# Patient Record
Sex: Male | Born: 1975 | Race: White | Hispanic: Yes | Marital: Single | Smoking: Never smoker
Health system: Southern US, Community
[De-identification: ages and names within clinical notes are randomized; demographics above are authoritative.]

---

## 2004-11-06 ENCOUNTER — Emergency Department: Payer: Self-pay | Admitting: Emergency Medicine

## 2006-03-15 IMAGING — CR DG WRIST COMPLETE 3+V*R*
1 series · 1 of 1 positions shown · non-contrast
Comparison: none

REASON FOR EXAM: foreign body  rm12
COMMENTS:

PROCEDURE:     DXR - DXR WRIST RT COMP WITH OBLIQUES  - November 06, 2004  [DATE]
RESULT:     Multiple views of the RIGHT wrist show no evidence of fracture,
foreign body, or soft tissue swelling.

[view not recorded]
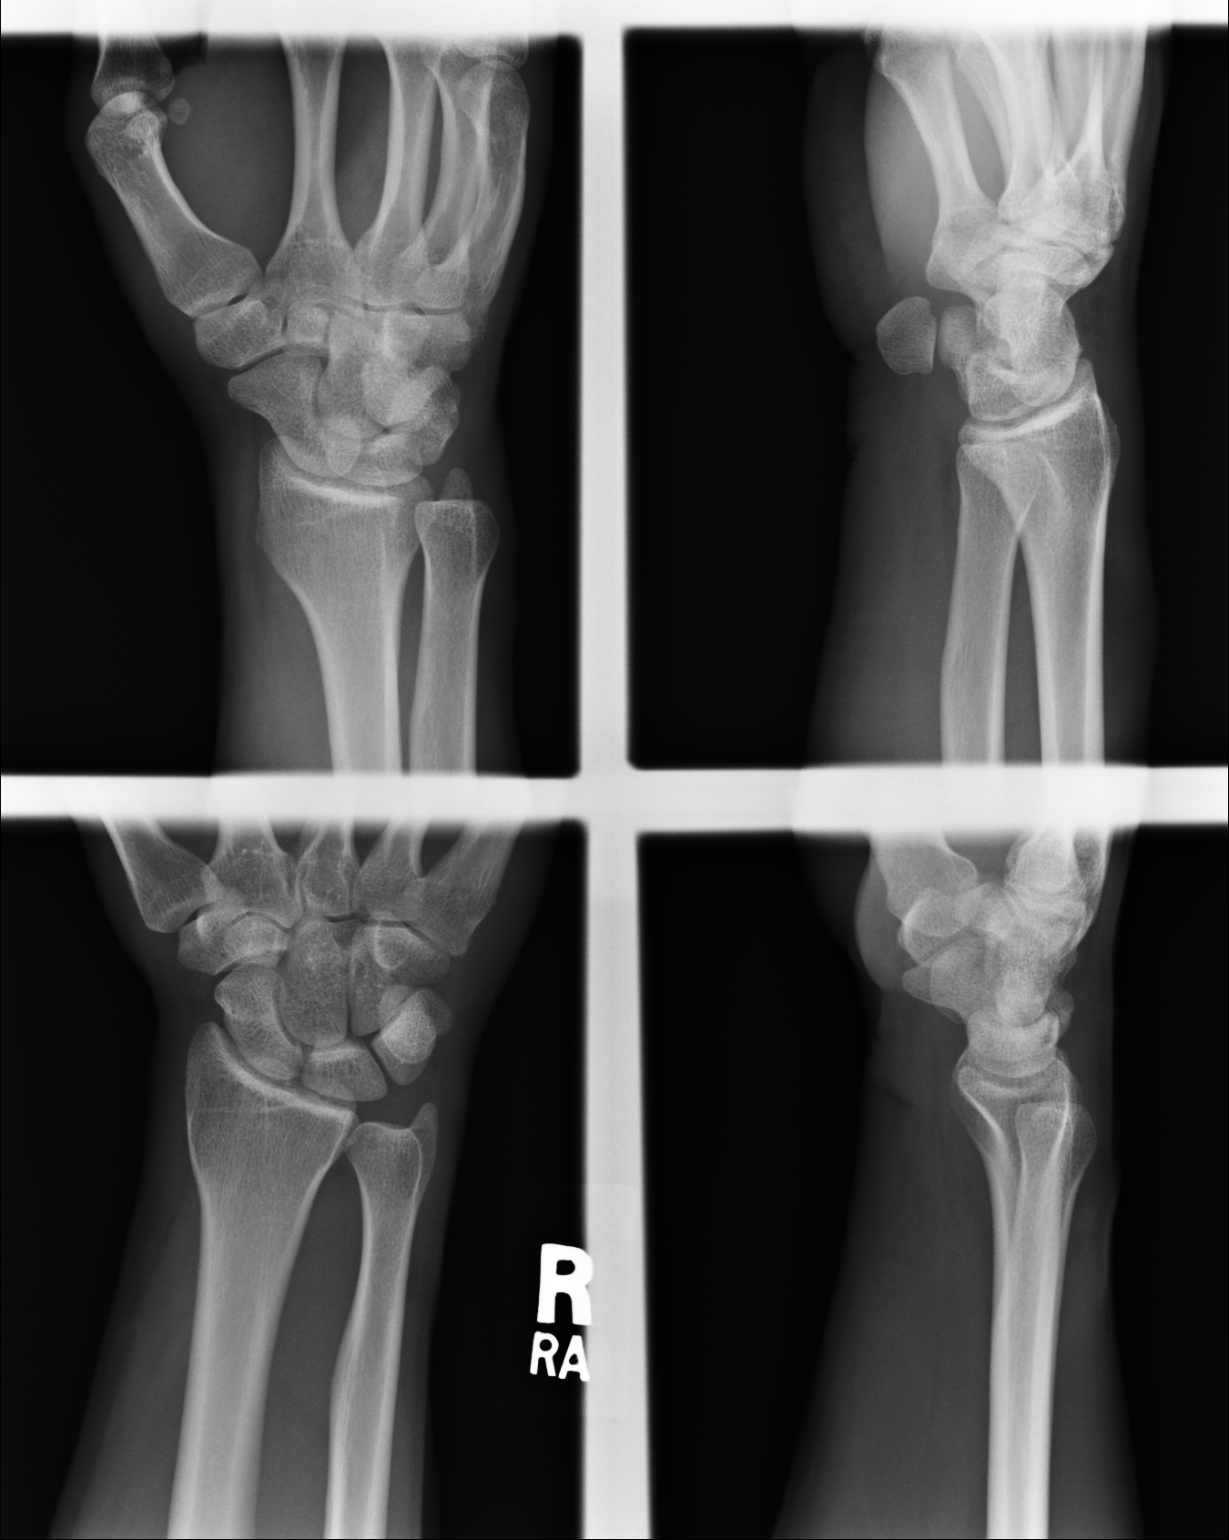

[1 of 1 positions shown; findings below may reference images not displayed]

IMPRESSION: 1)Normal RIGHT wrist.

## 2009-01-06 ENCOUNTER — Emergency Department (HOSPITAL_COMMUNITY): Admission: EM | Admit: 2009-01-06 | Discharge: 2009-01-06 | Payer: Self-pay | Admitting: Emergency Medicine

## 2009-02-03 ENCOUNTER — Emergency Department: Payer: Self-pay | Admitting: Emergency Medicine

## 2022-10-25 DIAGNOSIS — F1320 Sedative, hypnotic or anxiolytic dependence, uncomplicated: Secondary | ICD-10-CM | POA: Diagnosis not present

## 2022-10-25 DIAGNOSIS — F1010 Alcohol abuse, uncomplicated: Secondary | ICD-10-CM | POA: Diagnosis not present

## 2022-10-25 DIAGNOSIS — F310 Bipolar disorder, current episode hypomanic: Secondary | ICD-10-CM | POA: Diagnosis not present

## 2022-10-25 DIAGNOSIS — Z5181 Encounter for therapeutic drug level monitoring: Secondary | ICD-10-CM | POA: Diagnosis not present

## 2022-11-15 DIAGNOSIS — F1010 Alcohol abuse, uncomplicated: Secondary | ICD-10-CM | POA: Diagnosis not present

## 2022-11-15 DIAGNOSIS — F310 Bipolar disorder, current episode hypomanic: Secondary | ICD-10-CM | POA: Diagnosis not present

## 2022-12-15 DIAGNOSIS — F1010 Alcohol abuse, uncomplicated: Secondary | ICD-10-CM | POA: Diagnosis not present

## 2022-12-15 DIAGNOSIS — F310 Bipolar disorder, current episode hypomanic: Secondary | ICD-10-CM | POA: Diagnosis not present

## 2022-12-15 DIAGNOSIS — F1410 Cocaine abuse, uncomplicated: Secondary | ICD-10-CM | POA: Diagnosis not present

## 2022-12-22 DIAGNOSIS — Z5181 Encounter for therapeutic drug level monitoring: Secondary | ICD-10-CM | POA: Diagnosis not present

## 2022-12-22 DIAGNOSIS — F310 Bipolar disorder, current episode hypomanic: Secondary | ICD-10-CM | POA: Diagnosis not present

## 2022-12-22 DIAGNOSIS — Z79899 Other long term (current) drug therapy: Secondary | ICD-10-CM | POA: Diagnosis not present

## 2022-12-22 DIAGNOSIS — F1520 Other stimulant dependence, uncomplicated: Secondary | ICD-10-CM | POA: Diagnosis not present

## 2023-01-10 DIAGNOSIS — F1410 Cocaine abuse, uncomplicated: Secondary | ICD-10-CM | POA: Diagnosis not present

## 2023-01-10 DIAGNOSIS — F310 Bipolar disorder, current episode hypomanic: Secondary | ICD-10-CM | POA: Diagnosis not present

## 2023-01-19 DIAGNOSIS — Z5181 Encounter for therapeutic drug level monitoring: Secondary | ICD-10-CM | POA: Diagnosis not present

## 2023-01-19 DIAGNOSIS — Z79899 Other long term (current) drug therapy: Secondary | ICD-10-CM | POA: Diagnosis not present

## 2023-01-19 DIAGNOSIS — F310 Bipolar disorder, current episode hypomanic: Secondary | ICD-10-CM | POA: Diagnosis not present

## 2023-01-19 DIAGNOSIS — F1520 Other stimulant dependence, uncomplicated: Secondary | ICD-10-CM | POA: Diagnosis not present

## 2023-01-20 ENCOUNTER — Encounter: Payer: Self-pay | Admitting: Emergency Medicine

## 2023-01-20 ENCOUNTER — Other Ambulatory Visit: Payer: Self-pay

## 2023-01-20 ENCOUNTER — Emergency Department
Admission: EM | Admit: 2023-01-20 | Discharge: 2023-01-20 | Disposition: A | Discharge status: Home / Self Care | Payer: Medicaid Other | Source: Home / Self Care | Attending: Emergency Medicine | Admitting: Emergency Medicine

## 2023-01-20 DIAGNOSIS — T43595A Adverse effect of other antipsychotics and neuroleptics, initial encounter: Secondary | ICD-10-CM | POA: Diagnosis not present

## 2023-01-20 DIAGNOSIS — R42 Dizziness and giddiness: Secondary | ICD-10-CM | POA: Diagnosis not present

## 2023-01-20 DIAGNOSIS — T50905A Adverse effect of unspecified drugs, medicaments and biological substances, initial encounter: Secondary | ICD-10-CM

## 2023-01-20 NOTE — ED Triage Notes (Signed)
Pt here with dizziness. Pt states he was at work and went to bend down and immediately became dizzy and had to sit down. Pt denies dizziness at the moment. Pt having some diarrhea but denies NV.

## 2023-01-20 NOTE — Discharge Instructions (Signed)
I suspect your lightheadedness was related to the Abilify shot that you had yesterday, this is known to cause lightheadedness.  Please rest stay very hydrated and be careful when changing from a seated to standing position.  The symptoms should improve the further way you get from the injection time

## 2023-01-20 NOTE — ED Provider Notes (Signed)
[facility] Provider Note    Event Date/Time   First MD Initiated Contact with Patient 01/20/23 1148     (approximate)   History   Dizziness   HPI  Edwin Greer is a 47 y.o. male with no significant past medical history who presents with complaints of lightheadedness.  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  It does require him to stand and sit frequently but it is not particularly physical.  He reports he felt lightheaded and had to sit down and rest.  This is never happened to him before.  He denies chest pain or palpitations.  He reports he did get first injection of Abilify yesterday     Physical Exam   Triage Vital Signs: ED Triage Vitals [01/20/23 1141]  Encounter Vitals Group     BP 132/88     Systolic BP Percentile      Diastolic BP Percentile      Pulse Rate 61     Resp 18     Temp 98 F (36.7 C)     Temp src      SpO2 99 %     Weight 68 kg (150 lb)     Height 1.6 m (5\' 3" )     Head Circumference      Peak Flow      Pain Score 0     Pain Loc      Pain Education      Exclude from Growth Chart     Most recent vital signs: Vitals:   01/20/23 1141  BP: 132/88  Pulse: 61  Resp: 18  Temp: 98 F (36.7 C)  SpO2: 99%     General: Awake, no distress.  CV:  Good peripheral perfusion.  Resp:  Normal effort.  Abd:  No distention.  Other:     ED Results / Procedures / Treatments   Labs (all labs ordered are listed, but only abnormal results are displayed) Labs Reviewed  BASIC METABOLIC PANEL  CBC  URINALYSIS, ROUTINE W REFLEX MICROSCOPIC  CBG MONITORING, ED     EKG  ED ECG REPORT I, Jene Every, the attending physician, personally viewed and interpreted this ECG.  Date: 01/20/2023  Rhythm: normal sinus rhythm QRS Axis: normal Intervals: normal ST/T Wave abnormalities: normal Narrative Interpretation: no evidence of acute ischemia    RADIOLOGY     PROCEDURES:  Critical Care  performed:   Procedures   MEDICATIONS ORDERED IN ED: Medications - No data to display   IMPRESSION / MDM / ASSESSMENT AND PLAN / ED COURSE  I reviewed the triage vital signs and the nursing notes. Patient's presentation is most consistent with acute complicated illness / injury requiring diagnostic workup.  Patient presents after a near syncopal episode as described above.  No palpitations or chest pain to suggest cardiac arrhythmia or ACS.  EKG is quite reassuring.  Patient had Abilify long-acting injection yesterday, common side effect of this is lightheadedness and dizziness especially soon after the injection.  I suspect this is the cause of his symptoms.  No indication for extensive workup as the patient is asymptomatic here in the emergency department.  Appropriate for discharge, recommend hydration, careful when changing positions, rest over the weekend can return to work on Monday.        FINAL CLINICAL IMPRESSION(S) / ED DIAGNOSES   Final diagnoses:  Lightheadedness  Medication side effect, initial encounter     Rx / DC Orders  ED Discharge Orders     None        Note:  This document was prepared using Dragon voice recognition software and may include unintentional dictation errors.   Jene Every, MD 01/20/23 [phone]

## 2023-01-31 DIAGNOSIS — F1420 Cocaine dependence, uncomplicated: Secondary | ICD-10-CM | POA: Diagnosis not present

## 2023-02-14 DIAGNOSIS — F310 Bipolar disorder, current episode hypomanic: Secondary | ICD-10-CM | POA: Diagnosis not present

## 2023-02-14 DIAGNOSIS — F1410 Cocaine abuse, uncomplicated: Secondary | ICD-10-CM | POA: Diagnosis not present

## 2023-02-16 DIAGNOSIS — Z5181 Encounter for therapeutic drug level monitoring: Secondary | ICD-10-CM | POA: Diagnosis not present

## 2023-02-16 DIAGNOSIS — F1520 Other stimulant dependence, uncomplicated: Secondary | ICD-10-CM | POA: Diagnosis not present

## 2023-02-17 DIAGNOSIS — Z79899 Other long term (current) drug therapy: Secondary | ICD-10-CM | POA: Diagnosis not present

## 2023-03-09 ENCOUNTER — Emergency Department
Admission: EM | Admit: 2023-03-09 | Discharge: 2023-03-09 | Disposition: A | Discharge status: Home / Self Care | Payer: MEDICAID | Attending: Emergency Medicine | Admitting: Emergency Medicine

## 2023-03-09 ENCOUNTER — Other Ambulatory Visit: Payer: Self-pay

## 2023-03-09 DIAGNOSIS — F1910 Other psychoactive substance abuse, uncomplicated: Secondary | ICD-10-CM | POA: Insufficient documentation

## 2023-03-09 NOTE — ED Notes (Signed)
Pt here with S/O who also has same complaint for withdrawal s/s.

## 2023-03-09 NOTE — ED Triage Notes (Addendum)
Pt is requesting detox from Alcohol, marijuana, and cocaine. Pt reports his last use was last pm. Pt denies withdrawal s/s   Pt is here with his girlfriend and states that she is here for the same care

## 2023-03-09 NOTE — ED Provider Notes (Signed)
   [facility] Provider Note    Event Date/Time   First MD Initiated Contact with Patient 03/09/23 1531     (approximate)   History   Substance abuse  HPI  Edwin Greer is a 47 y.o. male  who presents to the emergency department today because he desires outpatient detox. States he would like to detox from alcohol, cocaine and marijuana.        Physical Exam   Triage Vital Signs: ED Triage Vitals  Encounter Vitals Group     BP 03/09/23 1334 122/87     Systolic BP Percentile --      Diastolic BP Percentile --      Pulse Rate 03/09/23 1334 72     Resp 03/09/23 1334 16     Temp 03/09/23 1334 98.5 F (36.9 C)     Temp Source 03/09/23 1334 Oral     SpO2 03/09/23 1334 97 %     Weight 03/09/23 1335 160 lb (72.6 kg)     Height 03/09/23 1335 5\' 3"  (1.6 m)     Head Circumference --      Peak Flow --      Pain Score 03/09/23 1335 7     Pain Loc --      Pain Education --      Exclude from Growth Chart --     Most recent vital signs: Vitals:   03/09/23 1334  BP: 122/87  Pulse: 72  Resp: 16  Temp: 98.5 F (36.9 C)  SpO2: 97%   General: Awake, alert, oriented. CV:  Good peripheral perfusion.  Resp:  Normal effort.  Abd:  No distention.     ED Results / Procedures / Treatments   Labs (all labs ordered are listed, but only abnormal results are displayed) Labs Reviewed - No data to display   EKG  none   RADIOLOGY None  PROCEDURES:  Critical Care performed: No   MEDICATIONS ORDERED IN ED: Medications - No data to display   IMPRESSION / MDM / ASSESSMENT AND PLAN / ED COURSE  I reviewed the triage vital signs and the nursing notes.                              Patient presented to the emergency department desiring outpatient detox from multiple substances. Gave patient list of outpatient substance abuse resources.    FINAL CLINICAL IMPRESSION(S) / ED DIAGNOSES   Final diagnoses:  Substance abuse (HCC)     Note:   This document was prepared using Dragon voice recognition software and may include unintentional dictation errors.    Phineas Semen, MD 03/09/23 [phone]

## 2023-04-07 ENCOUNTER — Telehealth: Payer: Self-pay

## 2023-04-07 NOTE — Telephone Encounter (Signed)
Transition Care Management Unsuccessful Follow-up Telephone Call  Date of discharge and from where:  [place] 9/12  Attempts:  1st Attempt  Reason for unsuccessful TCM follow-up call:  No answer/busy   Lenard Forth [place]  [facility], [facility] Guide, Phone: [phone] Website: [email or web address]

## 2023-04-07 NOTE — Telephone Encounter (Signed)
Transition Care Management Unsuccessful Follow-up Telephone Call  Date of discharge and from where:  [place] 9/12  Attempts:  2nd  Reason for unsuccessful TCM follow-up call:  No answer/busy   Lenard Forth [employer]  [facility], [facility] Guide, Phone: [phone] Website: [email or web address]

## 2023-05-08 DIAGNOSIS — F1420 Cocaine dependence, uncomplicated: Secondary | ICD-10-CM | POA: Diagnosis not present

## 2023-05-08 DIAGNOSIS — F1020 Alcohol dependence, uncomplicated: Secondary | ICD-10-CM | POA: Diagnosis not present
